# Patient Record
Sex: Female | Born: 1970 | Race: Black or African American | Hispanic: No | Marital: Married | Smoking: Never smoker
Health system: Southern US, Community
[De-identification: ages and names within clinical notes are randomized; demographics above are authoritative.]

## PROBLEM LIST (undated history)

## (undated) DIAGNOSIS — Z789 Other specified health status: Secondary | ICD-10-CM

---

## 1998-08-19 ENCOUNTER — Emergency Department (HOSPITAL_COMMUNITY): Admission: EM | Admit: 1998-08-19 | Discharge: 1998-08-19 | Payer: Self-pay | Admitting: Emergency Medicine

## 1999-10-31 ENCOUNTER — Other Ambulatory Visit: Admission: RE | Admit: 1999-10-31 | Discharge: 1999-10-31 | Payer: Self-pay | Admitting: Obstetrics

## 1999-11-17 ENCOUNTER — Inpatient Hospital Stay (HOSPITAL_COMMUNITY): Admission: AD | Admit: 1999-11-17 | Discharge: 1999-11-17 | Payer: Self-pay | Admitting: Obstetrics

## 1999-11-24 ENCOUNTER — Encounter (INDEPENDENT_AMBULATORY_CARE_PROVIDER_SITE_OTHER): Payer: Self-pay | Admitting: Specialist

## 1999-11-24 ENCOUNTER — Ambulatory Visit (HOSPITAL_COMMUNITY): Admission: AD | Admit: 1999-11-24 | Discharge: 1999-11-24 | Payer: Self-pay | Admitting: Obstetrics

## 1999-11-25 ENCOUNTER — Encounter: Payer: Self-pay | Admitting: Obstetrics

## 2000-03-25 ENCOUNTER — Other Ambulatory Visit: Admission: RE | Admit: 2000-03-25 | Discharge: 2000-03-25 | Payer: Self-pay | Admitting: Obstetrics

## 2000-10-17 ENCOUNTER — Inpatient Hospital Stay (HOSPITAL_COMMUNITY): Admission: AD | Admit: 2000-10-17 | Discharge: 2000-10-21 | Payer: Self-pay | Admitting: Obstetrics

## 2000-10-17 ENCOUNTER — Encounter (INDEPENDENT_AMBULATORY_CARE_PROVIDER_SITE_OTHER): Payer: Self-pay | Admitting: Specialist

## 2011-05-04 NOTE — Op Note (Signed)
[facility]  Patient:    Angela Sutton, Angela Sutton                      MRN: [id number] Proc. Date: 10/18/00 Adm. Date:  [id number] Attending:  Venita Sheffield                           Operative Report  PREOPERATIVE DIAGNOSES:       Uterine pregnancy post dates with a nonreassuring fetal heart pattern and failure to progress.  OPERATION:                    Primary low transverse cervical cesarean section.  POSTOPERATIVE DIAGNOSES:      Uterine pregnancy post dates with a nonreassuring fetal heart pattern and failure to progress, viable female infant with Apgars of 1 and 9.  SURGEON:                      Deniece Ree, M.D.  PEDIATRICS:                   Teaching service.  ANESTHESIA:                   Epidural by Dr. Jean Rosenthal.  ESTIMATED BLOOD LOSS:         300 cc.  DRAINS:                       Foley left to straight drainage.                                Patient tolerated the procedure well and returned to the recovery room in satisfactory condition.  PROCEDURE:                    Patient was taken to the operating room and prepped and draped in the usual fashion for a primary cesarean section.  A low Pfannenstiel incision was made.  This was carried down to the fascia at which time the fascia was entered and excised to the extent of the incision.  The midline was identified and rectus muscles separated.  The abdominal peritoneum was then ______ with Metzenbaum scissors.  The visceroperitoneum was then excised bilaterally toward the round ligaments following which the lower uterine segment was scored, entered in the midline, and bluntly dissected upward.  There was some difficulty.  The infants head was delivered at which time the nasopharynx was sucked out with a suction bulb.  Complete delivery then carried out without any problems.  The cord was clamped and infant turned over to the pediatricians who were in attendance.  The cord  blood was then obtained following which the placenta as well as all products of conception were then manually removed from the uterine cavity.  IV Pitocin as well as IV antibiotics were then begun.  The myometrium was then closed using #1 Chromic in a running locking stitch followed by an embrocating stitch again using #1 Chromic.  Reapproximation using 2-0 Chromic in a running stitch.  At this point the sponge and needle count was correct x 2.  Hemostasis was present. Tubes and ovaries bilaterally appeared to be within normal limits.  The abdominal peritoneum was then closed using 2-0 Chromic in a running stitch followed by closure of the fascia using #1  Vicryl in a running stitch.  A subcuticular stitch using 3-0 Vicryl was utilized to close the skin.  The procedure was then terminated.  The patient tolerated the procedure well and returned to the recovery room in satisfactory condition. DD:  10/18/00 TD:  10/18/00 Job: [id number]

## 2011-05-04 NOTE — H&P (Signed)
[facility]  Patient:    KELLENE, MCCLEARY                      MRN: [id number] Adm. Date:  [id number] Attending:  Venita Sheffield                         History and Physical  HISTORY:                      The patient is a 40 year old gravida 2 abortus 1 whose estimated date of confinement was October 09, 2000.  This is a patient of Dr. Tawny Hopping who was admitted for induction.  The patient progressed satisfactorily to approximately 4 cm, 90% effaced, vertex at a +1 station, at which time she stalled.  Pitocin was started and patient began having a nonreassuring heart rate with late decelerations with contractions.  After we evaluated the patient for several hours, the decision was then to proceed with a cesarean section.  PHYSICAL EXAMINATION:  GENERAL:                      She is a well-developed, well-nourished gravid female in labor-type distress.  HEENT:                        Within normal limits.  NECK:                         Supple.  BREASTS:                      Without masses, tenderness, or discharge.  LUNGS:                        Clear to percussion and auscultation.  HEART:                        Normal sinus rhythm without murmur, rub, or gallop.  ABDOMEN:                      Term gravid.  Fetal heart beats 124 to 248 in the left lower quadrant.  EXTREMITIES AND NEUROLOGIC:   Within normal limits.  PELVIC:                       Reveals cervix that was 4 cm dilated, vertex presentation, at a +1 to a +2 station with moderate amount of molding present.  DIAGNOSES AT THIS TIME:       1.Intrauterine pregnancy, post dates, with a                                 nonreassuring fetal heart pattern.                               2. Failure to progress.  PLAN:                         Proceed with a cesarean section. DD:  10/18/00 TD:  10/18/00 Job: [id number]

## 2011-05-04 NOTE — Discharge Summary (Signed)
[facility]  Patient:    Angela Sutton, Angela Sutton                      MRN: [id number] Adm. Date:  [id number] Disc. Date: 10/21/00 Attending:  Venita Sheffield                           Discharge Summary  HISTORY OF PRESENT ILLNESS:   The patient is a 40 year old, gravida 2, para 0-0-1-0, [facility] October 09, 2000, who was brought in for induction.  Cervix was 1 cm, 50%, and the vertex was 0 to +1 station.  HOSPITAL COURSE:              Amniotomy was performed. The fluid was clear. The patient was started on Pitocin, and the patient underwent primary low transverse cesarean section because of nonreassuring fetal heart rate tracing. She had female with Apgars 1 and 9.  Postop she did fine.  Her hemoglobin was 11.9.  She was discharged home on the third postoperative day.  DISCHARGE MEDICATION:         Tylenol No. 3 one q.4h. p.r.n.  FOLLOWUP:                     See me in six weeks.  DISCHARGE DIAGNOSIS:          Status post primary low transverse cesarean section at term for nonreassuring fetal heart rate tracing. DD:  10/21/00 TD:  10/21/00 Job: [id number]

## 2011-07-18 ENCOUNTER — Encounter (HOSPITAL_COMMUNITY)
Admission: RE | Admit: 2011-07-18 | Discharge: 2011-07-18 | Disposition: A | Discharge status: Home / Self Care | Payer: BC Managed Care – PPO | Source: Ambulatory Visit | Attending: Obstetrics and Gynecology | Admitting: Obstetrics and Gynecology

## 2011-07-18 ENCOUNTER — Encounter (HOSPITAL_COMMUNITY): Payer: Self-pay

## 2011-07-18 ENCOUNTER — Other Ambulatory Visit (HOSPITAL_COMMUNITY): Payer: BC Managed Care – PPO

## 2011-07-18 HISTORY — DX: Other specified health status: Z78.9

## 2011-07-18 LAB — CBC
MCH: 31.2 pg (ref 26.0–34.0)
MCHC: 33.8 g/dL (ref 30.0–36.0)
Platelets: 164 10*3/uL (ref 150–400)
RDW: 13.9 % (ref 11.5–15.5)

## 2011-07-18 LAB — RPR: RPR Ser Ql: NONREACTIVE

## 2011-07-18 NOTE — Patient Instructions (Addendum)
07/26/1219 [name] Angela Sutton  07/18/2011   Your procedure is scheduled on:  07/26/11  Report to [facility] at 8 AM.  Call this number if you have problems the morning of surgery: [phone]   Remember:   Do not eat food:After Midnight.  Do not drink clear liquids: After Midnight.  Take these medicines the morning of surgery with A SIP OF WATER: NA   Do not wear jewelry, make-up or nail polish.  Do not wear lotions, powders, or perfumes. You may wear deodorant.  Do not shave 48 hours prior to surgery.  Do not bring valuables to the hospital.  Contacts, dentures or bridgework may not be worn into surgery.  Leave suitcase in the car. After surgery it may be brought to your room.  For patients admitted to the hospital, checkout time is 11:00 AM the day of discharge.   Patients discharged the day of surgery will not be allowed to drive home.  Name and phone number of your driver: NA  Special Instructions: use CHG wash per written instruction sheet   Please read over the following fact sheets that you were given: MRSA Information

## 2011-07-19 ENCOUNTER — Encounter (HOSPITAL_COMMUNITY): Payer: Self-pay

## 2011-07-24 NOTE — H&P (Signed)
NAMEMarland Sutton  DEBROH, SIELOFF        ACCOUNT NO.:  [id number]  MEDICAL RECORD NO.:  [id number]  LOCATION:  SDC                           FACILITY:  [facility]  PHYSICIAN:  Naima A. Dillard, M.D. DATE OF BIRTH:  12/05/1971  DATE OF ADMISSION:  07/18/2011 DATE OF DISCHARGE:  07/18/2011                             HISTORY & PHYSICAL   Ms. Angela Sutton is a 40 year old gravida 3, para 1-0-1-1, who presents at 51 weeks' for scheduled repeat cesarean section and bilateral tubal sterilization.  Her pregnancy has been remarkable for: 1. Previous cesarean section with a plan for repeat. 2. Low BMI. 3. Advanced maternal age. 4. Group B strep negative.  PRENATAL LABORATORY DATA:  Blood type is AB positive, Rh antibody negative, VDRL nonreactive, rubella titer positive, hepatitis B surface antigen negative, HIV was nonreactive and varicella titer was immune. Hemoglobin upon entering to practice was 13.5.  It was 11.6 at 28 weeks. The patient had a normal first trimester screen.  She had a normal AFP. She had mildly elevated white blood cell count on her new OB labs.  This was repeated.  White blood cells remained 18.4 without any signs and symptoms of infection.  One-hour Glucola was 136.  She had a 3-hour GTT that was normal.  Group B strep was negative at 36 weeks.  Hemoglobin electrophoresis was normal.  HISTORY OF PRESENT PREGNANCY:  The patient entered care at approximately 8-9 weeks' gestation.  She has had a 10-week ultrasound showing an EDC of August 02, 2011, which was concurrent with her LMP dating.  She did want to have first trimester screen.  This was done and was normal.  AFP was also normal.  She had a normal Pap and cultures at her first visit. She was planning a repeat C-section.  We obtained her op note.  It did show a primary low transverse cesarean section secondary to nonreassuring fetal heart rate with a two-layer closure.  Apgars of that child were 1 and 9, but has done  well subsequently.  She had another ultrasound for anatomy at 18 weeks.  There was a left ventricular intracardiac focus noted.  This was resolved on a followup ultrasound at 27 weeks showing also normal growth and normal fluid.  She had an elevated 1-hour Glucola, but a normal 3-hour.  Group B strep culture was negative.  She did also desire tubal sterilization.  OBSTETRICAL HISTORY:  In 2001, she had a miscarriage.  In 2001, she also had a primary low transverse cesarean section for a female infant weight 7 pounds 9 ounces at 41 weeks' gestation.  She was induced.  The C-section was done for nonreassuring fetal heart rate.  Infant had 1/9 Apgars, but did well.  The patient progressed to 5-6 cm.  MEDICAL HISTORY:  History of usual childhood illnesses except she did not note that if she has had chickenpox, therefore, the varicella titer was done during this pregnancy and was noted to be immune.  Her only surgery was a C-section in 2001.  She has a GI sensitivity to Codeine which causes nausea and vomiting.  FAMILY HISTORY:  Maternal grandmother, maternal aunt and the patient's mother all have hypertension.  Her first cousin has insulin-dependent diabetes.  GENETIC HISTORY:  Remarkable for the patient's age of 30 at the time of delivery.  SOCIAL HISTORY:  The patient is married to the father of baby.  He is involved and supportive.  His name is Maris Berger.  The patient has 2 years of college.  She is an Print production planner.  Her husband has 3 years of college.  He is a Occupational hygienist in Educational psychologist.  The patient is Tree surgeon.  She denies religious affiliation.  She has been followed by the [facility].  She denies any alcohol, drug or tobacco use during this pregnancy.  PHYSICAL EXAMINATION:  VITAL SIGNS:  Stable. GENERAL:  The patient is febrile. HEENT: Within normal limits. LUNGS:  Her breath sounds are clear. HEART:  Regular rate and  rhythm without murmur. BREASTS:  Soft and nontender. ABDOMEN:  Fundal height is approximately 38-cm.  Estimated fetal weight is approximately 7-7.5 pounds.  Uterine contractions are very occasionally mild.  Fetal heart rate has been in the 130s-140s on office assessment. PELVIC:  Deferred. EXTREMITIES:  Deep tendon reflexes are 2+ without clonus.  There is no edema noted.  The patient's weight on one of her last prenatal visits was 152 and her height is 5/5.  IMPRESSION: 1. Intrauterine pregnancy at 39 weeks. 2. Previous cesarean section with desire for repeat. 3. Desires tubal sterilization. 4. Advanced maternal age. 5. Elevated white blood cell count during pregnancy without evidence     of infection. 6. Group B strep negative.  PLAN: 1. Admit to [facility] for consult with Dr. Jaymes Graff as attending physician. 2. Routine physician preoperative orders.     Renaldo Reel Emilee Hero, C.N.M.   ______________________________ Pierre Bali Normand Sloop, M.D.    Leeanne Mannan  D:  07/24/2011  T:  07/24/2011  Job:  [id number]

## 2011-07-26 ENCOUNTER — Encounter (HOSPITAL_COMMUNITY): Payer: Self-pay | Admitting: *Deleted

## 2011-07-26 ENCOUNTER — Encounter (HOSPITAL_COMMUNITY)
Admission: RE | Disposition: A | Discharge status: Home / Self Care | Payer: Self-pay | Source: Ambulatory Visit | Attending: Obstetrics and Gynecology

## 2011-07-26 ENCOUNTER — Inpatient Hospital Stay (HOSPITAL_COMMUNITY)
Admission: RE | Admit: 2011-07-26 | Discharge: 2011-07-28 | DRG: 370 | Disposition: A | Discharge status: Home / Self Care | Payer: BC Managed Care – PPO | Source: Ambulatory Visit | Attending: Obstetrics and Gynecology | Admitting: Obstetrics and Gynecology

## 2011-07-26 ENCOUNTER — Other Ambulatory Visit: Payer: Self-pay | Admitting: Obstetrics and Gynecology

## 2011-07-26 ENCOUNTER — Encounter (HOSPITAL_COMMUNITY): Payer: Self-pay | Admitting: Anesthesiology

## 2011-07-26 ENCOUNTER — Inpatient Hospital Stay (HOSPITAL_COMMUNITY): Payer: BC Managed Care – PPO | Admitting: Anesthesiology

## 2011-07-26 ENCOUNTER — Inpatient Hospital Stay (HOSPITAL_COMMUNITY)
Admission: AD | Admit: 2011-07-26 | Payer: BC Managed Care – PPO | Source: Ambulatory Visit | Admitting: Obstetrics and Gynecology

## 2011-07-26 DIAGNOSIS — D649 Anemia, unspecified: Secondary | ICD-10-CM | POA: Diagnosis present

## 2011-07-26 DIAGNOSIS — Z302 Encounter for sterilization: Secondary | ICD-10-CM

## 2011-07-26 DIAGNOSIS — O34219 Maternal care for unspecified type scar from previous cesarean delivery: Principal | ICD-10-CM | POA: Diagnosis present

## 2011-07-26 DIAGNOSIS — O9903 Anemia complicating the puerperium: Secondary | ICD-10-CM | POA: Diagnosis not present

## 2011-07-26 DIAGNOSIS — Z01818 Encounter for other preprocedural examination: Secondary | ICD-10-CM

## 2011-07-26 DIAGNOSIS — Z98891 History of uterine scar from previous surgery: Secondary | ICD-10-CM

## 2011-07-26 DIAGNOSIS — Z01812 Encounter for preprocedural laboratory examination: Secondary | ICD-10-CM

## 2011-07-26 SURGERY — Surgical Case
Anesthesia: Spinal | Site: Abdomen | Laterality: Bilateral | Wound class: Clean Contaminated

## 2011-07-26 SURGERY — Surgical Case
Anesthesia: Spinal

## 2011-07-26 MED ORDER — KETOROLAC TROMETHAMINE 30 MG/ML IJ SOLN
30.0000 mg | Freq: Four times a day (QID) | INTRAMUSCULAR | Status: AC | PRN
  Administered 2011-07-26: 30 mg via INTRAVENOUS
  Filled 2011-07-26: qty 1

## 2011-07-26 MED ORDER — PHENYLEPHRINE 40 MCG/ML (10ML) SYRINGE FOR IV PUSH (FOR BLOOD PRESSURE SUPPORT)
PREFILLED_SYRINGE | INTRAVENOUS | Status: AC
  Filled 2011-07-26: qty 5

## 2011-07-26 MED ORDER — ZOLPIDEM TARTRATE 5 MG PO TABS: 5.0000 mg | ORAL_TABLET | Freq: Every evening | ORAL | Status: DC | PRN

## 2011-07-26 MED ORDER — CITRIC ACID-SODIUM CITRATE 334-500 MG/5ML PO SOLN: 30.0000 mL | Freq: Once | ORAL | Status: DC | PRN

## 2011-07-26 MED ORDER — BUPIVACAINE IN DEXTROSE 0.75-8.25 % IT SOLN
INTRATHECAL | Status: DC | PRN
  Administered 2011-07-26: 1.6 mL via INTRATHECAL

## 2011-07-26 MED ORDER — FLEET ENEMA 7-19 GM/118ML RE ENEM: 1.0000 | ENEMA | RECTAL | Status: DC | PRN

## 2011-07-26 MED ORDER — MENTHOL 3 MG MT LOZG: 1.0000 | LOZENGE | OROMUCOSAL | Status: DC | PRN

## 2011-07-26 MED ORDER — MORPHINE SULFATE (PF) 0.5 MG/ML IJ SOLN
INTRAMUSCULAR | Status: DC | PRN
  Administered 2011-07-26: .15 mg via EPIDURAL

## 2011-07-26 MED ORDER — KETOROLAC TROMETHAMINE 30 MG/ML IJ SOLN: 15.0000 mg | Freq: Once | INTRAMUSCULAR | Status: DC | PRN

## 2011-07-26 MED ORDER — OXYTOCIN 20 UNITS IN LACTATED RINGERS INFUSION - SIMPLE: 125.0000 mL/h | INTRAVENOUS | Status: AC

## 2011-07-26 MED ORDER — IBUPROFEN 600 MG PO TABS
600.0000 mg | ORAL_TABLET | Freq: Four times a day (QID) | ORAL | Status: DC
  Administered 2011-07-27 – 2011-07-28 (×6): 600 mg via ORAL
  Filled 2011-07-26 (×6): qty 1

## 2011-07-26 MED ORDER — PROMETHAZINE HCL 25 MG/ML IJ SOLN: 6.2500 mg | INTRAMUSCULAR | Status: DC | PRN

## 2011-07-26 MED ORDER — DIPHENHYDRAMINE HCL 25 MG PO CAPS: 25.0000 mg | ORAL_CAPSULE | Freq: Four times a day (QID) | ORAL | Status: DC | PRN

## 2011-07-26 MED ORDER — DIBUCAINE 1 % RE OINT: 1.0000 "application " | TOPICAL_OINTMENT | RECTAL | Status: DC | PRN

## 2011-07-26 MED ORDER — LACTATED RINGERS IV SOLN: INTRAVENOUS | Status: DC

## 2011-07-26 MED ORDER — WITCH HAZEL-GLYCERIN EX PADS: 1.0000 "application " | MEDICATED_PAD | CUTANEOUS | Status: DC | PRN

## 2011-07-26 MED ORDER — ACETAMINOPHEN 325 MG PO TABS: 325.0000 mg | ORAL_TABLET | ORAL | Status: DC | PRN

## 2011-07-26 MED ORDER — ONDANSETRON HCL 4 MG/2ML IJ SOLN: 4.0000 mg | INTRAMUSCULAR | Status: DC | PRN

## 2011-07-26 MED ORDER — TETANUS-DIPHTH-ACELL PERTUSSIS 5-2.5-18.5 LF-MCG/0.5 IM SUSP
0.5000 mL | Freq: Once | INTRAMUSCULAR | Status: AC
Start: 2011-07-27 — End: 2011-07-27
  Administered 2011-07-27: 0.5 mL via INTRAMUSCULAR
  Filled 2011-07-26: qty 0.5

## 2011-07-26 MED ORDER — MORPHINE SULFATE 0.5 MG/ML IJ SOLN
INTRAMUSCULAR | Status: AC
  Filled 2011-07-26: qty 10

## 2011-07-26 MED ORDER — EPHEDRINE SULFATE 50 MG/ML IJ SOLN
INTRAMUSCULAR | Status: DC | PRN
  Administered 2011-07-26 (×3): 10 mg via INTRAVENOUS

## 2011-07-26 MED ORDER — FENTANYL CITRATE 0.05 MG/ML IJ SOLN
INTRAMUSCULAR | Status: DC | PRN
  Administered 2011-07-26: 20 ug via INTRAVENOUS

## 2011-07-26 MED ORDER — CEFAZOLIN SODIUM 1-5 GM-% IV SOLN
1.0000 g | INTRAVENOUS | Status: AC
  Administered 2011-07-26: 1 g via INTRAVENOUS

## 2011-07-26 MED ORDER — ONDANSETRON HCL 4 MG/2ML IJ SOLN
INTRAMUSCULAR | Status: DC | PRN
  Administered 2011-07-26: 4 mg via INTRAVENOUS

## 2011-07-26 MED ORDER — KETOROLAC TROMETHAMINE 30 MG/ML IJ SOLN
30.0000 mg | Freq: Four times a day (QID) | INTRAMUSCULAR | Status: DC | PRN
  Administered 2011-07-26: 30 mg via INTRAMUSCULAR

## 2011-07-26 MED ORDER — PHENYLEPHRINE HCL 10 MG/ML IJ SOLN
INTRAMUSCULAR | Status: DC | PRN
  Administered 2011-07-26: 40 ug via INTRAVENOUS
  Administered 2011-07-26 (×2): 80 ug via INTRAVENOUS
  Administered 2011-07-26 (×2): 120 ug via INTRAVENOUS

## 2011-07-26 MED ORDER — HYDROMORPHONE HCL 2 MG PO TABS: 1.0000 mg | ORAL_TABLET | ORAL | Status: DC | PRN

## 2011-07-26 MED ORDER — ONDANSETRON HCL 4 MG/2ML IJ SOLN
INTRAMUSCULAR | Status: AC
  Filled 2011-07-26: qty 2

## 2011-07-26 MED ORDER — FERROUS SULFATE 325 (65 FE) MG PO TABS
325.0000 mg | ORAL_TABLET | Freq: Two times a day (BID) | ORAL | Status: DC
  Administered 2011-07-27 (×2): 325 mg via ORAL
  Filled 2011-07-26 (×3): qty 1

## 2011-07-26 MED ORDER — FAMOTIDINE 20 MG PO TABS: 20.0000 mg | ORAL_TABLET | Freq: Once | ORAL | Status: DC | PRN

## 2011-07-26 MED ORDER — SIMETHICONE 80 MG PO CHEW
80.0000 mg | CHEWABLE_TABLET | Freq: Three times a day (TID) | ORAL | Status: DC
  Administered 2011-07-26 – 2011-07-27 (×5): 80 mg via ORAL

## 2011-07-26 MED ORDER — MEASLES, MUMPS & RUBELLA VAC ~~LOC~~ INJ
0.5000 mL | INJECTION | Freq: Once | SUBCUTANEOUS | Status: DC
  Filled 2011-07-26: qty 0.5

## 2011-07-26 MED ORDER — SENNOSIDES-DOCUSATE SODIUM 8.6-50 MG PO TABS
2.0000 | ORAL_TABLET | Freq: Every day | ORAL | Status: DC
  Administered 2011-07-26 – 2011-07-27 (×2): 2 via ORAL

## 2011-07-26 MED ORDER — PRENATAL PLUS 27-1 MG PO TABS
1.0000 | ORAL_TABLET | Freq: Every day | ORAL | Status: DC
  Administered 2011-07-27: 1 via ORAL
  Filled 2011-07-26: qty 1

## 2011-07-26 MED ORDER — FENTANYL CITRATE 0.05 MG/ML IJ SOLN: 25.0000 ug | INTRAMUSCULAR | Status: DC | PRN

## 2011-07-26 MED ORDER — LANOLIN HYDROUS EX OINT: 1.0000 "application " | TOPICAL_OINTMENT | CUTANEOUS | Status: DC | PRN

## 2011-07-26 MED ORDER — LACTATED RINGERS IV SOLN
INTRAVENOUS | Status: DC
  Administered 2011-07-26: 09:00:00 via INTRAVENOUS

## 2011-07-26 MED ORDER — OXYTOCIN 10 UNIT/ML IJ SOLN
INTRAMUSCULAR | Status: AC
  Filled 2011-07-26: qty 2

## 2011-07-26 MED ORDER — SCOPOLAMINE 1 MG/3DAYS TD PT72: 1.0000 | MEDICATED_PATCH | Freq: Once | TRANSDERMAL | Status: DC | PRN

## 2011-07-26 MED ORDER — PRENATAL PLUS 27-1 MG PO TABS: 1.0000 | ORAL_TABLET | Freq: Every day | ORAL | Status: DC

## 2011-07-26 MED ORDER — OXYTOCIN 10 UNIT/ML IJ SOLN
20.0000 [IU] | INTRAVENOUS | Status: DC | PRN
  Administered 2011-07-26: 20 [IU] via INTRAVENOUS

## 2011-07-26 MED ORDER — DEXAMETHASONE SODIUM PHOSPHATE 10 MG/ML IJ SOLN
INTRAMUSCULAR | Status: DC | PRN
  Administered 2011-07-26 (×2): 5 mg via INTRAVENOUS

## 2011-07-26 MED ORDER — PANTOPRAZOLE SODIUM 40 MG PO TBEC: 40.0000 mg | DELAYED_RELEASE_TABLET | Freq: Once | ORAL | Status: DC | PRN

## 2011-07-26 MED ORDER — KETOROLAC TROMETHAMINE 30 MG/ML IJ SOLN
INTRAMUSCULAR | Status: AC
  Administered 2011-07-26: 30 mg via INTRAMUSCULAR
  Filled 2011-07-26: qty 1

## 2011-07-26 MED ORDER — DEXAMETHASONE SODIUM PHOSPHATE 10 MG/ML IJ SOLN
INTRAMUSCULAR | Status: AC
  Filled 2011-07-26: qty 1

## 2011-07-26 MED ORDER — BISACODYL 10 MG RE SUPP: 10.0000 mg | Freq: Every day | RECTAL | Status: DC | PRN

## 2011-07-26 MED ORDER — METOCLOPRAMIDE HCL 10 MG PO TABS: 10.0000 mg | ORAL_TABLET | Freq: Once | ORAL | Status: DC | PRN

## 2011-07-26 MED ORDER — FENTANYL CITRATE 0.05 MG/ML IJ SOLN
INTRAMUSCULAR | Status: AC
  Filled 2011-07-26: qty 2

## 2011-07-26 MED ORDER — METOCLOPRAMIDE HCL 5 MG/ML IJ SOLN
INTRAMUSCULAR | Status: AC
  Administered 2011-07-26: 10 mg via INTRAVENOUS
  Filled 2011-07-26: qty 2

## 2011-07-26 MED ORDER — METOCLOPRAMIDE HCL 5 MG/ML IJ SOLN
10.0000 mg | Freq: Three times a day (TID) | INTRAMUSCULAR | Status: DC | PRN
  Administered 2011-07-26: 10 mg via INTRAVENOUS

## 2011-07-26 MED ORDER — SCOPOLAMINE 1 MG/3DAYS TD PT72
1.0000 | MEDICATED_PATCH | TRANSDERMAL | Status: DC
  Administered 2011-07-26: 1.5 mg via TRANSDERMAL

## 2011-07-26 MED ORDER — ONDANSETRON HCL 4 MG PO TABS: 4.0000 mg | ORAL_TABLET | ORAL | Status: DC | PRN

## 2011-07-26 SURGICAL SUPPLY — 36 items
APL SKNCLS STERI-STRIP NONHPOA (GAUZE/BANDAGES/DRESSINGS) ×1
BARRIER ADHS 3X4 INTERCEED (GAUZE/BANDAGES/DRESSINGS) ×1 IMPLANT
BENZOIN TINCTURE PRP APPL 2/3 (GAUZE/BANDAGES/DRESSINGS) ×2 IMPLANT
BRR ADH 4X3 ABS CNTRL BYND (GAUZE/BANDAGES/DRESSINGS) ×1
CLOTH BEACON ORANGE TIMEOUT ST (SAFETY) ×2 IMPLANT
CONTAINER PREFILL 10% NBF 15ML (MISCELLANEOUS) ×2 IMPLANT
DRAIN JACKSON PRT FLT 10 (DRAIN) IMPLANT
DRSG COVADERM 4X10 (GAUZE/BANDAGES/DRESSINGS) ×1 IMPLANT
ELECT REM PT RETURN 9FT ADLT (ELECTROSURGICAL) ×2
ELECTRODE REM PT RTRN 9FT ADLT (ELECTROSURGICAL) ×1 IMPLANT
EVACUATOR SILICONE 100CC (DRAIN) IMPLANT
EXTRACTOR VACUUM M CUP 4 TUBE (SUCTIONS) IMPLANT
GLOVE BIO SURGEON STRL SZ 6.5 (GLOVE) ×2 IMPLANT
GLOVE BIOGEL PI IND STRL 7.0 (GLOVE) ×1 IMPLANT
GLOVE BIOGEL PI INDICATOR 7.0 (GLOVE) ×1
GOWN PREVENTION PLUS LG XLONG (DISPOSABLE) ×6 IMPLANT
KIT ABG SYR 3ML LUER SLIP (SYRINGE) IMPLANT
NDL HYPO 25X5/8 SAFETYGLIDE (NEEDLE) IMPLANT
NEEDLE HYPO 25X5/8 SAFETYGLIDE (NEEDLE) IMPLANT
NS IRRIG 1000ML POUR BTL (IV SOLUTION) ×3 IMPLANT
PACK C SECTION WH (CUSTOM PROCEDURE TRAY) ×2 IMPLANT
RTRCTR C-SECT PINK 25CM LRG (MISCELLANEOUS) IMPLANT
SLEEVE SCD COMPRESS KNEE MED (MISCELLANEOUS) IMPLANT
STAPLER VISISTAT 35W (STAPLE) IMPLANT
STRIP CLOSURE SKIN 1/2X4 (GAUZE/BANDAGES/DRESSINGS) ×2 IMPLANT
SUT CHROMIC 0 CT 1 (SUTURE) ×2 IMPLANT
SUT MNCRL AB 3-0 PS2 27 (SUTURE) ×1 IMPLANT
SUT PLAIN 2 0 (SUTURE) ×4
SUT PLAIN 2 0 XLH (SUTURE) ×1 IMPLANT
SUT PLAIN ABS 2-0 CT1 27XMFL (SUTURE) ×2 IMPLANT
SUT SILK 2 0 SH (SUTURE) IMPLANT
SUT VIC AB 0 CTX 36 (SUTURE) ×12
SUT VIC AB 0 CTX36XBRD ANBCTRL (SUTURE) ×4 IMPLANT
TOWEL OR 17X24 6PK STRL BLUE (TOWEL DISPOSABLE) ×4 IMPLANT
TRAY FOLEY CATH 14FR (SET/KITS/TRAYS/PACK) ×2 IMPLANT
WATER STERILE IRR 1000ML POUR (IV SOLUTION) ×1 IMPLANT

## 2011-07-26 NOTE — Anesthesia Preprocedure Evaluation (Signed)
Anesthesia Evaluation  Name, MR# and DOB Patient awake  General Assessment Comment  Reviewed: Allergy & Precautions, H&P , Patient's Chart, lab work & pertinent test results  Airway Mallampati: II TM Distance: >3 FB Neck ROM: full    Dental No notable dental hx.    Pulmonary  clear to auscultation  pulmonary exam normalPulmonary Exam Normal breath sounds clear to auscultation none    Cardiovascular regular Normal    Neuro/Psych Negative Neurological ROS  Negative Psych ROS  GI/Hepatic/Renal negative GI ROS, negative Liver ROS, and negative Renal ROS (+)       Endo/Other  Negative Endocrine ROS (+)      Abdominal   Musculoskeletal   Hematology negative hematology ROS (+)   Peds  Reproductive/Obstetrics (+) Pregnancy    Anesthesia Other Findings             Anesthesia Physical Anesthesia Plan  ASA: II  Anesthesia Plan: Spinal   Post-op Pain Management:    Induction:   Airway Management Planned:   Additional Equipment:   Intra-op Plan:   Post-operative Plan:   Informed Consent: I have reviewed the patients History and Physical, chart, labs and discussed the procedure including the risks, benefits and alternatives for the proposed anesthesia with the patient or authorized representative who has indicated his/her understanding and acceptance.     Plan Discussed with:   Anesthesia Plan Comments:         Anesthesia Quick Evaluation

## 2011-07-26 NOTE — Consult Note (Signed)
Neonatology Note:   Attendance at C-section:    I was asked to attend this repeat C/S at term. The mother is a G3P1A1 AB pos, GBS neg with an uncomplicated pregnancy. ROM at delivery, fluid clear. Infant vigorous with good spontaneous cry and tone. Needed only minimal bulb suctioning. Ap 9/9. Lungs clear to ausc in DR. To CN to care of Pediatrician.   Deatra James, MD

## 2011-07-26 NOTE — Progress Notes (Signed)
Mother states with last child she only breast fed while in hospital and plans to do the same with this child. Left nipple flat and dimpled. Right nipple semi erect.  Pre pumped right nipple and infant latched well with assistance . inst mother in positioning and good support. Lots of teaching on benefits of exclusive breastfeeding for at least 6 months.  Parents receptive to all teaching.

## 2011-07-26 NOTE — Anesthesia Procedure Notes (Signed)
Spinal Block  Patient location during procedure: OR Staffing Anesthesiologist: Emiliya Chretien EDWARD Preanesthetic Checklist Completed: patient identified, site marked, surgical consent, pre-op evaluation, timeout performed, IV checked, risks and benefits discussed and monitors and equipment checked Spinal Block Patient position: sitting Prep: DuraPrep Patient monitoring: heart rate, cardiac monitor, continuous pulse ox and blood pressure Approach: midline Location: L3-4 Injection technique: single-shot Needle Needle type: Sprotte  Needle gauge: 24 G Needle length: 9 cm Assessment Sensory level: T4 Additional Notes Spinal Dosage in OR  Bupivicaine ml       1.6 PFMS04   mcg        150 Fentanyl mcg            20    

## 2011-07-26 NOTE — Anesthesia Postprocedure Evaluation (Addendum)
Anesthesia Post Note  Patient: Angela Sutton  Procedure(s) Performed:  CESAREAN SECTION WITH BILATERAL TUBAL LIGATION - Repeat Cesarean Section with Delivery Baby Boy @ [phone], Apgars 9/9; Bilateral Tubal Ligation  Anesthesia type: Spinal  Patient location: PACU  Post pain: Pain level controlled  Post assessment: Post-op Vital signs reviewed  Last Vitals:  Filed Vitals:   07/26/11 1115  BP: 124/62  Pulse: 85  Temp:   Resp: 16    Post vital signs: Reviewed  Level of consciousness: awake  Complications: No apparent anesthesia complications

## 2011-07-26 NOTE — Op Note (Signed)
Cesarean Section Procedure Note   AKANSHA WYCHE  07/26/2011  Indications: Scheduled Proceedure/Maternal Request   Pre-operative Diagnosis: previous c/s desires sterilization.   Post-operative Diagnosis: Same   Surgeon: Surgeon(s) and Role:    * Michael Litter, MD - Primary   Assistants: Nigel Bridgeman CNM  Anesthesia: spinal   Procedure Details:  The patient was seen in the Holding Room. The risks, benefits, complications, treatment options, and expected outcomes were discussed with the patient. The patient concurred with the proposed plan, giving informed consent. identified as Hillery Hunter and the procedure verified as C-Section Delivery. A Time Out was held and the above information confirmed.  After induction of anesthesia, the patient was draped and prepped in the usual sterile manner. A transverse was made and carried down through the subcutaneous tissue to the fascia. Fascial incision was made and extended transversely using mayo scissors. The fascia was separated from the underlying rectus tissue superiorly and inferiorly using cokers and mayo scissors. The peritoneum was identified and entered. Peritoneal incision was extended longitudinally with good visualization of bowel and bladder. The utero-vesical peritoneal reflection was incised transversely and the bladder flap was bluntly freed from the lower uterine segment. A low transverse uterine incision was made. Delivered from cephalic presentation was a 8-4 pounds with Apgar scores of 9 at one minute and 9 at five minutes. Cord ph was not sent the umbilical cord was clamped and cut cord blood was obtained for evaluation. The placenta was removed Intact and appeared normal. I did curette the uterus to remove some remaining membrane.   The uterine outline, tubes and ovaries appeared normal}. The uterine incision was closed with running locked sutures of 0Vicryl. A second layer 0 vicrlyl was used to imbricate the uterine  incision    Hemostasis was observed. Lavage was carried out until clear. The left fallopian tube was grasped with babcock clamp and a centimeter of tube was ligated with 2-0 plain suture and excised. The fight fallopian tube was ligated and excised in a similar way.  There was bleeding noted from the area just below the ligation.  A kelly clamp was placed below the bleeding and a o vicrly free tie was used to achieve hemostasis.  Interceede was placed  Along the uterus in a T  Shape.  The peritoneum was closed with 0 chromic.  The fascia was then reapproximated with running sutures of 0 vicryl. The  The skin was closed with 3-22monocryl.   Instrument, sponge, and needle counts were correct prior the abdominal closure and were correct at the conclusion of the case.    Findings:female infant in vertex presentation.  Clear fluid was noted.     Estimated Blood Loss: 700cc  Total IV Fluids:   Urine Output: 150CC OF clear urine  Specimens:  Specimens    None       Complications: no complications  Disposition: PACU - hemodynamically stable.   Maternal Condition: stable   Baby condition / location:  nursery-stable  Attending Attestation: I was present and scrubbed for the entire procedure.   Signed: Surgeon(s): Michael Litter, MD

## 2011-07-27 LAB — CBC
MCH: 30.4 pg (ref 26.0–34.0)
MCHC: 32.8 g/dL (ref 30.0–36.0)
Platelets: 160 10*3/uL (ref 150–400)
RBC: 3.68 MIL/uL — ABNORMAL LOW (ref 3.87–5.11)

## 2011-07-27 MED ORDER — HYDROCODONE-ACETAMINOPHEN 5-325 MG PO TABS
1.0000 | ORAL_TABLET | ORAL | Status: DC | PRN
  Administered 2011-07-28: 1 via ORAL
  Filled 2011-07-27: qty 1

## 2011-07-27 MED ORDER — ACETAMINOPHEN 500 MG PO TABS
1000.0000 mg | ORAL_TABLET | Freq: Four times a day (QID) | ORAL | Status: DC | PRN
  Filled 2011-07-27: qty 2

## 2011-07-27 MED ORDER — MEASLES, MUMPS & RUBELLA VAC ~~LOC~~ INJ: 0.5000 mL | INJECTION | SUBCUTANEOUS | Status: DC | PRN

## 2011-07-27 NOTE — Plan of Care (Signed)
Problem: Discharge Progression Outcomes Goal: MMR given as ordered Outcome: Not Progressing Rubella - equivical  NEEDS

## 2011-07-27 NOTE — Progress Notes (Addendum)
Subjective: Postpartum Day 1: Cesarean Delivery Patient reports no problems voiding.  Up ad lib.  No flatus yet.  Breastfeeding.    Objective: Vital signs in last 24 hours: Temp:  [97.4 F (36.3 C)-98.1 F (36.7 C)] 98 F (36.7 C) (08/10 0414) Pulse Rate:  [63-107] 72  (08/10 0414) Resp:  [16-23] 20  (08/10 0414) BP: (96-146)/(53-80) 96/63 mmHg (08/10 0414) SpO2:  [98 %-100 %] 99 % (08/10 0414)  Physical Exam:  General: alert Lochia: appropriate Uterine Fundus: firm Incision: Dressing with old drainage marked, no advancement overnight. DVT Evaluation: No evidence of DVT seen on physical exam.   Basename 07/27/11 0515  HGB 11.2*  HCT 34.1*    Assessment/Plan: Status post Cesarean section and BTL day 1.  Doing well postoperatively.  Continue current care.  LATHAM,VICKI L 07/27/2011, 8:36 AM   WBC = 19,000.  WILL REPEAT IN AM Dierdre Forth, MD  @now @

## 2011-07-27 NOTE — Anesthesia Postprocedure Evaluation (Signed)
  Anesthesia Post-op Note  Patient: Angela Sutton  Procedure(s) Performed:  CESAREAN SECTION WITH BILATERAL TUBAL LIGATION - Repeat Cesarean Section with Delivery Baby Boy @ [phone], Apgars 9/9; Bilateral Tubal Ligation  Patient Location: PACU and Mother/Baby  Anesthesia Type: Regional and Spinal  Level of Consciousness: awake, alert  and oriented  Airway and Oxygen Therapy: Patient Spontanous Breathing  Post-op Pain: none  Post-op Assessment: Post-op Vital signs reviewed  Post-op Vital Signs: Reviewed and stable  Complications: No apparent anesthesia complications

## 2011-07-28 ENCOUNTER — Encounter (HOSPITAL_COMMUNITY): Payer: Self-pay | Admitting: *Deleted

## 2011-07-28 DIAGNOSIS — D649 Anemia, unspecified: Secondary | ICD-10-CM | POA: Diagnosis present

## 2011-07-28 DIAGNOSIS — Z98891 History of uterine scar from previous surgery: Secondary | ICD-10-CM

## 2011-07-28 LAB — CBC
MCH: 30.3 pg (ref 26.0–34.0)
MCV: 93.6 fL (ref 78.0–100.0)
Platelets: 165 10*3/uL (ref 150–400)
RDW: 14.1 % (ref 11.5–15.5)
WBC: 15.1 10*3/uL — ABNORMAL HIGH (ref 4.0–10.5)

## 2011-07-28 LAB — DIFFERENTIAL
Basophils Absolute: 0 10*3/uL (ref 0.0–0.1)
Eosinophils Absolute: 0.1 10*3/uL (ref 0.0–0.7)
Eosinophils Relative: 0 % (ref 0–5)

## 2011-07-28 MED ORDER — SENNOSIDES-DOCUSATE SODIUM 8.6-50 MG PO TABS: 2.0000 | ORAL_TABLET | Freq: Two times a day (BID) | ORAL | Status: AC | PRN

## 2011-07-28 MED ORDER — BISACODYL 10 MG RE SUPP: 10.0000 mg | Freq: Every day | RECTAL | Status: AC | PRN

## 2011-07-28 MED ORDER — HYDROCODONE-ACETAMINOPHEN 5-325 MG PO TABS: 1.0000 | ORAL_TABLET | ORAL | Status: AC | PRN

## 2011-07-28 MED ORDER — FERROUS SULFATE 325 (65 FE) MG PO TABS: 325.0000 mg | ORAL_TABLET | Freq: Two times a day (BID) | ORAL | Status: AC

## 2011-07-28 MED ORDER — IBUPROFEN 600 MG PO TABS: 600.0000 mg | ORAL_TABLET | Freq: Four times a day (QID) | ORAL | Status: AC | PRN

## 2011-07-28 NOTE — Progress Notes (Signed)
Subjective: Postpartum Day 2: Cesarean Delivery Patient reports tolerating PO, + flatus and no problems voiding.    Objective: Vital signs in last 24 hours: Temp:  [98 F (36.7 C)-98.6 F (37 C)] 98.6 F (37 C) (08/11 0600) Pulse Rate:  [97-101] 100  (08/11 0600) Resp:  [18-19] 19  (08/11 0600) BP: (95-134)/(53-75) 96/53 mmHg (08/11 0600) SpO2:  [99 %] 99 % (08/11 0600)  Physical Exam:  General: alert, cooperative, appears stated age and no distress Lochia: appropriate Uterine Fundus: firm Incision: healing well, no significant drainage, no dehiscence, no significant erythema DVT Evaluation: No evidence of DVT seen on physical exam. No cords or calf tenderness. No significant calf/ankle edema.   Basename 07/28/11 0810 07/27/11 0515  HGB 10.9* 11.2*  HCT 33.7* 34.1*    Assessment/Plan: Status post Cesarean section. Doing well postoperatively.  Discharge home with standard precautions and return to clinic in 4-6 weeks per patient request.  Amauri Keefe P 07/28/2011, 10:39 AM

## 2011-07-28 NOTE — Discharge Summary (Signed)
Obstetric Discharge Summary Reason for Admission: cesarean section Prenatal Procedures: none Intrapartum Procedures: cesarean: low cervical, transverse Postpartum Procedures: none Complications-Operative and Postpartum: none Hemoglobin  Date Value Range Status  07/28/2011 10.9* 12.0-15.0 (g/dL) Final     HCT  Date Value Range Status  07/28/2011 33.7* 36.0-46.0 (%) Final    Discharge Diagnoses: Term Pregnancy-delivered and history of prior cesarean.  Desire for surgical sterilization  Discharge Information: Date: 07/28/2011 Activity: pelvic rest and per discharge instruction booklet Diet: routine and see Med rec Medications: Ibuprophen, Viocodin and see AVS Condition: stable and improved Instructions: refer to practice specific booklet Discharge to: home Hosp course;  Patient was admitted via the OR after repeat Cesarean section and tubal ligation. Did well post op.  Tolerated post op anemia well.  Requested discharge home on POD#2.  Tolerating po's well, good pain management.  Newborn Data: Live born female  Birth Weight: 8 lb 3.9 oz (3740 g) APGAR: 9, 9  Home with mother.  Angela Sutton,Angela Sutton 07/28/2011, 10:56 AM

## 2011-08-10 NOTE — Transfer of Care (Signed)
  Anesthesia Post-op Note  Patient: Angela Sutton  Procedure(s) Performed:  CESAREAN SECTION WITH BILATERAL TUBAL LIGATION - Repeat Cesarean Section with Delivery Baby Boy @ [phone], Apgars 9/9; Bilateral Tubal Ligation  Patient Location: PACU  Anesthesia Type: Spinal  Level of Consciousness: awake, alert  and oriented  Airway and Oxygen Therapy: Patient Spontanous Breathing  Post-op Pain: none  Post-op Assessment: Post-op Vital signs reviewed  Post-op Vital Signs: Reviewed  Complications: No apparent anesthesia complications

## 2011-08-10 NOTE — Transfer of Care (Deleted)
  Anesthesia Post-op Note  Patient: Angela Sutton  Procedure(s) Performed:  CESAREAN SECTION WITH BILATERAL TUBAL LIGATION - Repeat Cesarean Section with Delivery Baby Boy @ [phone], Apgars 9/9; Bilateral Tubal Ligation  Patient Location: PACU  Anesthesia Type: Spinal  Level of Consciousness: awake, alert  and oriented  Airway and Oxygen Therapy: Patient Spontanous Breathing  Post-op Pain: none  Post-op Assessment: Post-op Vital signs reviewed  Post-op Vital Signs: Reviewed and stable  Complications: No apparent anesthesia complications

## 2012-03-19 ENCOUNTER — Encounter (HOSPITAL_COMMUNITY): Payer: Self-pay | Admitting: *Deleted

## 2014-10-18 ENCOUNTER — Encounter (HOSPITAL_COMMUNITY): Payer: Self-pay | Admitting: *Deleted

## 2018-05-27 ENCOUNTER — Other Ambulatory Visit: Payer: Self-pay

## 2018-05-27 ENCOUNTER — Ambulatory Visit (HOSPITAL_COMMUNITY)
Admission: EM | Admit: 2018-05-27 | Discharge: 2018-05-27 | Disposition: A | Discharge status: Home / Self Care | Payer: BC Managed Care – PPO | Attending: Family Medicine | Admitting: Family Medicine

## 2018-05-27 ENCOUNTER — Ambulatory Visit (INDEPENDENT_AMBULATORY_CARE_PROVIDER_SITE_OTHER): Payer: BC Managed Care – PPO

## 2018-05-27 ENCOUNTER — Encounter (HOSPITAL_COMMUNITY): Payer: Self-pay | Admitting: Emergency Medicine

## 2018-05-27 DIAGNOSIS — S63259A Unspecified dislocation of unspecified finger, initial encounter: Secondary | ICD-10-CM

## 2018-05-27 DIAGNOSIS — Y9364 Activity, baseball: Secondary | ICD-10-CM | POA: Diagnosis not present

## 2018-05-27 DIAGNOSIS — S63256A Unspecified dislocation of right little finger, initial encounter: Secondary | ICD-10-CM

## 2018-05-27 DIAGNOSIS — S63286A Dislocation of proximal interphalangeal joint of right little finger, initial encounter: Secondary | ICD-10-CM | POA: Diagnosis not present

## 2018-05-27 MED ORDER — LIDOCAINE HCL 2 % IJ SOLN
INTRAMUSCULAR | Status: AC
  Filled 2018-05-27: qty 20

## 2018-05-27 NOTE — Discharge Instructions (Signed)
SPLINT OR BUDDY TAPE TO PROTECT FINGER FOR 2-3 WEEKS IBUPROFEN FOR PAIN  ICE AND ELEVATE TO REDUCE SWELLING

## 2018-05-27 NOTE — ED Provider Notes (Signed)
[facility]    CSN: [id number] Arrival date & time: 05/27/18  1001     History   Chief Complaint Chief Complaint  Patient presents with  . Finger Injury    HPI Angela Sutton is a 47 y.o. female.   HPI  Patient injured her right fifth finger playing softball last night.  She states she had it sliding into a base and felt immediate pop and pain.  She is been using ice.  Ibuprofen for pain.  She has deformity and pain at the DIP.  Normal sensation at the tip.  No prior hand or finger injuries.  She is in good health and on no medications except for vitamins.  Past Medical History:  Diagnosis Date  . No pertinent past medical history     Patient Active Problem List   Diagnosis Date Noted  . H/O: cesarean section 07/28/2011  . Anemia 07/28/2011    Past Surgical History:  Procedure Laterality Date  . CESAREAN SECTION  2001    OB History    Gravida  4   Para  2   Term  2   Preterm      AB  1   Living  2     SAB  1   TAB      Ectopic      Multiple      Live Births  1            Home Medications    Prior to Admission medications   Medication Sig Start Date End Date Taking? Authorizing Provider  ferrous sulfate 325 (65 FE) MG tablet Take 1 tablet (325 mg total) by mouth 2 (two) times daily with a meal. 07/28/11 07/27/12  Haygood, Maris BergerVanessa P, MD  prenatal vitamin w/FE, FA (PRENATAL 1 + 1) 27-1 MG TABS Take 1 tablet by mouth daily.      [provider]    Family History Family History  Problem Relation Age of Onset  . Healthy Mother   . Healthy Father     Social History Social History   Tobacco Use  . Smoking status: Never Smoker  Substance Use Topics  . Alcohol use: No  . Drug use: No     Allergies   Codeine   Review of Systems Review of Systems  Constitutional: Negative for chills and fever.  HENT: Negative for ear pain and sore throat.   Eyes: Negative for pain and visual disturbance.  Respiratory:  Negative for cough and shortness of breath.   Cardiovascular: Negative for chest pain and palpitations.  Gastrointestinal: Negative for abdominal pain and vomiting.  Genitourinary: Negative for dysuria and hematuria.  Musculoskeletal: Positive for arthralgias. Negative for back pain.       Right fifth finger pain  Skin: Negative for color change and rash.  Neurological: Negative for seizures and syncope.  All other systems reviewed and are negative.    Physical Exam Triage Vital Signs ED Triage Vitals  Enc Vitals Group     BP 05/27/18 1017 (!) 153/92     Pulse Rate 05/27/18 1017 80     Resp 05/27/18 1017 16     Temp 05/27/18 1017 98.7 F (37.1 C)     Temp Source 05/27/18 1017 Oral     SpO2 05/27/18 1017 100 %     Weight --      Height --      Head Circumference --      Peak Flow --  Pain Score 05/27/18 1038 7     Pain Loc --      Pain Edu? --      Excl. in GC? --    No data found.  Updated Vital Signs BP (!) 153/92 (BP Location: Right Arm)   Pulse 80   Temp 98.7 F (37.1 C) (Oral)   Resp 16   LMP 05/23/2018   SpO2 100%       Physical Exam  Constitutional: She appears well-developed and well-nourished. No distress.  HENT:  Head: Normocephalic and atraumatic.  Mouth/Throat: Oropharynx is clear and moist.  Eyes: Pupils are equal, round, and reactive to light. Conjunctivae are normal.  Neck: Normal range of motion.  Cardiovascular: Normal rate.  Pulmonary/Chest: Effort normal. No respiratory distress.  Abdominal: Soft. She exhibits no distension.  Musculoskeletal: Normal range of motion. She exhibits no edema.  The fifth finger on the right hand has obvious deformity and swelling at the DIP.  Limited range of motion.  Normal sensory exam.  Normal cap refill.  Neurological: She is alert.  Skin: Skin is warm and dry.     UC Treatments / Results  Labs (all labs ordered are listed, but only abnormal results are displayed) Labs Reviewed - No data to  display  EKG None  Radiology Dg Finger Little Right  Result Date: 05/27/2018 CLINICAL DATA:  Status post reduction of dorsal dislocation of the DIP joint of the right little finger due to an injury playing softball. Initial encounter. EXAM: RIGHT LITTLE FINGER 2+V COMPARISON:  Plain films right little finger the same day. FINDINGS: The DIP joint has been reduced. No fracture is identified. Soft tissues of the little finger appear swollen. IMPRESSION: Successful reduction of dislocation.  Negative for fracture. Electronically Signed   By: Drusilla Kanner M.D.   On: 05/27/2018 11:10   Dg Finger Little Right  Result Date: 05/27/2018 CLINICAL DATA:  Injury playing softball. EXAM: RIGHT LITTLE FINGER 2+V COMPARISON:  No recent. FINDINGS: Posteriorly dislocated distal phalanx of the right fifth digit noted. No evidence of fracture. IMPRESSION: Posterior dislocation of the distal phalanx of the right fifth digit. Electronically Signed   By: Maisie Fus  Register   On: 05/27/2018 10:32    Procedures Orthopedic Injury Treatment Date/Time: 05/27/2018 1:39 PM Performed by: Eustace Moore, MD Authorized by: Eustace Moore, MD   Consent:    Consent obtained:  Verbal   Consent given by:  Patient   Risks discussed:  Irreducible dislocation, nerve damage, recurrent dislocation and restricted joint movement   Alternatives discussed:  No treatmentInjury location: hand Location details: right hand Injury type: dislocation Pre-procedure neurovascular assessment: neurovascularly intact Pre-procedure distal perfusion: normal Pre-procedure neurological function: normal Pre-procedure range of motion: reduced Anesthesia: digital block  Anesthesia: Local anesthesia used: yes Local Anesthetic: lidocaine 2% without epinephrine Anesthetic total: 2 mL  Patient sedated: NoManipulation performed: yes Immobilization: splint Splint type: static finger Supplies used: aluminum splint Post-procedure  neurovascular assessment: post-procedure neurovascularly intact Post-procedure distal perfusion: normal Post-procedure neurological function: normal Post-procedure range of motion: normal Patient tolerance: Patient tolerated the procedure well with no immediate complications Comments: Digital block performed.  Good anesthesia.  Finger easily reduced.  Repeat x-rays document good positioning with no fracture.  Splint applied.  Post procedure care reviewed.    (including critical care time)  Medications Ordered in UC Medications - No data to display  Initial Impression / Assessment and Plan / UC Course  I have reviewed the triage vital signs and the  nursing notes.  Pertinent labs & imaging results that were available during my care of the patient were reviewed by me and considered in my medical decision making (see chart for details).      Final Clinical Impressions(s) / UC Diagnoses   Final diagnoses:  Dislocation of finger, initial encounter     Discharge Instructions     SPLINT OR BUDDY TAPE TO PROTECT FINGER FOR 2-3 WEEKS IBUPROFEN FOR PAIN  ICE AND ELEVATE TO REDUCE SWELLING   ED Prescriptions    None     Controlled Substance Prescriptions [place] Controlled Substance Registry consulted? Not Applicable   Eustace Moore, MD 05/27/18 1341

## 2018-05-27 NOTE — ED Triage Notes (Signed)
Right little finger injury.  Seen by provider prior to this nurse

## 2018-05-27 NOTE — ED Notes (Signed)
Dr Delton Seenelson applied finger splint

## 2019-09-14 ENCOUNTER — Other Ambulatory Visit: Payer: Self-pay

## 2019-09-14 DIAGNOSIS — Z20822 Contact with and (suspected) exposure to covid-19: Secondary | ICD-10-CM

## 2019-09-15 LAB — NOVEL CORONAVIRUS, NAA: SARS-CoV-2, NAA: NOT DETECTED

## 2020-01-28 ENCOUNTER — Ambulatory Visit: Payer: BC Managed Care – PPO | Attending: Family

## 2020-01-28 DIAGNOSIS — Z23 Encounter for immunization: Secondary | ICD-10-CM

## 2020-01-28 NOTE — Progress Notes (Signed)
   Covid-19 Vaccination Clinic  Name:  Angela Sutton    MRN: [id number] DOB: 1971/06/12  01/28/2020  Angela Sutton was observed post Covid-19 immunization for 15 minutes without incidence. She was provided with Vaccine Information Sheet and instruction to access the V-Safe system.   Angela Sutton was instructed to call 911 with any severe reactions post vaccine: Marland Kitchen Difficulty breathing  . Swelling of your face and throat  . A fast heartbeat  . A bad rash all over your body  . Dizziness and weakness    Immunizations Administered    Name Date Dose VIS Date Route   Moderna COVID-19 Vaccine 01/28/2020  2:26 PM 0.5 mL 11/17/2019 Intramuscular   Manufacturer: Moderna   Lot: [id number]   NDC: 21624-469-50

## 2020-03-01 ENCOUNTER — Ambulatory Visit: Payer: BC Managed Care – PPO | Attending: Internal Medicine

## 2020-03-01 DIAGNOSIS — Z23 Encounter for immunization: Secondary | ICD-10-CM

## 2020-03-01 NOTE — Progress Notes (Signed)
   Covid-19 Vaccination Clinic  Name:  Angela Sutton    MRN: [id number] DOB: December 08, 1971  03/01/2020  [name] was observed post Covid-19 immunization for 15 minutes without incident. She was provided with Vaccine Information Sheet and instruction to access the V-Safe system.   [name] was instructed to call 911 with any severe reactions post vaccine: Marland Kitchen Difficulty breathing  . Swelling of face and throat  . A fast heartbeat  . A bad rash all over body  . Dizziness and weakness   Immunizations Administered    Name Date Dose VIS Date Route   Moderna COVID-19 Vaccine 03/01/2020  2:26 PM 0.5 mL 11/17/2019 Intramuscular   Manufacturer: Moderna   Lot: [id number]   NDC: 47583-074-60

## 2020-12-23 ENCOUNTER — Ambulatory Visit: Payer: BC Managed Care – PPO | Attending: Family

## 2020-12-23 DIAGNOSIS — Z23 Encounter for immunization: Secondary | ICD-10-CM

## 2021-04-19 NOTE — Progress Notes (Signed)
   Covid-19 Vaccination Clinic  Name:  Angela Sutton    MRN: [id number] DOB: 21-Aug-1971  04/19/2021  [name] was observed post Covid-19 immunization for 15 minutes without incident. She was provided with Vaccine Information Sheet and instruction to access the V-Safe system.   [name] was instructed to call 911 with any severe reactions post vaccine: Marland Kitchen Difficulty breathing  . Swelling of face and throat  . A fast heartbeat  . A bad rash all over body  . Dizziness and weakness   Immunizations Administered    Name Date Dose VIS Date Route   Moderna Covid-19 Booster Vaccine 12/23/2020 11:15 AM 0.25 mL 10/05/2020 Intramuscular   Manufacturer: Moderna   Lot: [id number]   NDC: 31594-585-92

## 2021-09-20 ENCOUNTER — Emergency Department (HOSPITAL_BASED_OUTPATIENT_CLINIC_OR_DEPARTMENT_OTHER): Payer: BC Managed Care – PPO | Admitting: Radiology

## 2021-09-20 ENCOUNTER — Emergency Department (HOSPITAL_BASED_OUTPATIENT_CLINIC_OR_DEPARTMENT_OTHER)
Admission: EM | Admit: 2021-09-20 | Discharge: 2021-09-20 | Disposition: A | Discharge status: Home / Self Care | Payer: BC Managed Care – PPO | Attending: Emergency Medicine | Admitting: Emergency Medicine

## 2021-09-20 ENCOUNTER — Encounter (HOSPITAL_BASED_OUTPATIENT_CLINIC_OR_DEPARTMENT_OTHER): Payer: Self-pay

## 2021-09-20 ENCOUNTER — Other Ambulatory Visit: Payer: Self-pay

## 2021-09-20 DIAGNOSIS — M79661 Pain in right lower leg: Secondary | ICD-10-CM | POA: Insufficient documentation

## 2021-09-20 DIAGNOSIS — X58XXXA Exposure to other specified factors, initial encounter: Secondary | ICD-10-CM | POA: Insufficient documentation

## 2021-09-20 DIAGNOSIS — S96901A Unspecified injury of unspecified muscle and tendon at ankle and foot level, right foot, initial encounter: Secondary | ICD-10-CM | POA: Diagnosis present

## 2021-09-20 DIAGNOSIS — S86111A Strain of other muscle(s) and tendon(s) of posterior muscle group at lower leg level, right leg, initial encounter: Secondary | ICD-10-CM | POA: Diagnosis not present

## 2021-09-20 DIAGNOSIS — Y9364 Activity, baseball: Secondary | ICD-10-CM | POA: Insufficient documentation

## 2021-09-20 MED ORDER — OXYCODONE HCL 5 MG PO TABS
5.0000 mg | ORAL_TABLET | Freq: Once | ORAL | Status: AC
  Administered 2021-09-20: 5 mg via ORAL
  Filled 2021-09-20: qty 1

## 2021-09-20 MED ORDER — MORPHINE SULFATE 15 MG PO TABS: 7.5000 mg | ORAL_TABLET | ORAL | 0 refills | Status: AC | PRN

## 2021-09-20 MED ORDER — ACETAMINOPHEN 500 MG PO TABS
1000.0000 mg | ORAL_TABLET | Freq: Once | ORAL | Status: AC
  Administered 2021-09-20: 1000 mg via ORAL
  Filled 2021-09-20: qty 2

## 2021-09-20 MED ORDER — KETOROLAC TROMETHAMINE 15 MG/ML IJ SOLN
15.0000 mg | Freq: Once | INTRAMUSCULAR | Status: AC
  Administered 2021-09-20: 15 mg via INTRAMUSCULAR
  Filled 2021-09-20: qty 1

## 2021-09-20 NOTE — ED Notes (Signed)
Pt discharged to home.  Discharge instructions have been discussed with pt and/or family members.  Pt verbally acknowledges understanding of discharge instructions and endorses comprehension to check out at registration prior to leaving. 

## 2021-09-20 NOTE — Discharge Instructions (Signed)
Take 4 over the counter ibuprofen tablets 3 times a day or 2 over-the-counter naproxen tablets twice a day for pain. Also take tylenol 1000mg (2 extra strength) four times a day.   Follow up with ortho in the office.   Then take the pain medicine if you feel like you need it. Narcotics do not help with the pain, they only make you care about it less.  You can become addicted to this, people may break into your house to steal it.  It will constipate you.  If you drive under the influence of this medicine you can get a DUI.

## 2021-09-20 NOTE — ED Provider Notes (Signed)
[name] EMERGENCY DEPT Provider Note   CSN: [id number] Arrival date & time: 09/20/21  1949     History Chief Complaint  Patient presents with   Leg Injury    Angela Sutton is a 50 y.o. female.  50 yo F with a chief complaints of right calf pain.  This occurred while she was playing softball she was running from first base to second base and felt a sudden pop to the medial aspect of the calf.  Has had pain worse especially with bearing weight and flexing at the ankle.  She denies any pain at the knee or the hip.  Denies any other injury.  Has never had a problem like this before.  The history is provided by the patient.  Illness Severity:  Moderate Onset quality:  Gradual Duration:  2 hours Timing:  Constant Progression:  Unchanged Chronicity:  New Associated symptoms: myalgias   Associated symptoms: no chest pain, no congestion, no fever, no headaches, no nausea, no rhinorrhea, no shortness of breath, no vomiting and no wheezing       Past Medical History:  Diagnosis Date   No pertinent past medical history     Patient Active Problem List   Diagnosis Date Noted   H/O: cesarean section 07/28/2011   Anemia 07/28/2011    Past Surgical History:  Procedure Laterality Date   CESAREAN SECTION  2001     OB History     Gravida  4   Para  2   Term  2   Preterm      AB  1   Living  2      SAB  1   IAB      Ectopic      Multiple      Live Births  1           Family History  Problem Relation Age of Onset   Healthy Mother    Healthy Father     Social History   Tobacco Use   Smoking status: Never    Passive exposure: Never   Smokeless tobacco: Never  Vaping Use   Vaping Use: Never used  Substance Use Topics   Alcohol use: No   Drug use: No    Home Medications Prior to Admission medications   Medication Sig Start Date End Date Taking? Authorizing Provider  morphine (MSIR) 15 MG tablet Take 0.5 tablets (7.5  mg total) by mouth every 4 (four) hours as needed for severe pain. 09/20/21  Yes Melene Plan, DO  ferrous sulfate 325 (65 FE) MG tablet Take 1 tablet (325 mg total) by mouth 2 (two) times daily with a meal. 07/28/11 07/27/12  Haygood, Maris Berger, MD  prenatal vitamin w/FE, FA (PRENATAL 1 + 1) 27-1 MG TABS Take 1 tablet by mouth daily.      [provider]    Allergies    Codeine  Review of Systems   Review of Systems  Constitutional:  Negative for chills and fever.  HENT:  Negative for congestion and rhinorrhea.   Eyes:  Negative for redness and visual disturbance.  Respiratory:  Negative for shortness of breath and wheezing.   Cardiovascular:  Negative for chest pain and palpitations.  Gastrointestinal:  Negative for nausea and vomiting.  Genitourinary:  Negative for dysuria and urgency.  Musculoskeletal:  Positive for myalgias. Negative for arthralgias.  Skin:  Negative for pallor and wound.  Neurological:  Negative for dizziness and headaches.   Physical  Exam Updated Vital Signs BP (!) 158/95 (BP Location: Left Arm)   Pulse (!) 101   Temp 99.1 F (37.3 C) (Oral)   Resp 18   Ht 5\' 4"  (1.626 m)   Wt 74.4 kg   LMP 01/31/2021   SpO2 99%   BMI 28.15 kg/m   Physical Exam Vitals and nursing note reviewed.  Constitutional:      General: She is not in acute distress.    Appearance: She is well-developed. She is not diaphoretic.  HENT:     Head: Normocephalic and atraumatic.  Eyes:     Pupils: Pupils are equal, round, and reactive to light.  Cardiovascular:     Rate and Rhythm: Normal rate and regular rhythm.     Heart sounds: No murmur heard.   No friction rub. No gallop.  Pulmonary:     Effort: Pulmonary effort is normal.     Breath sounds: No wheezing or rales.  Abdominal:     General: There is no distension.     Palpations: Abdomen is soft.     Tenderness: There is no abdominal tenderness.  Musculoskeletal:        General: Tenderness present.     Cervical  back: Normal range of motion and neck supple.     Comments: Pain and swelling mostly to the medial aspect of the calf.  Thompson test appears to flex the foot.  No obvious deformity of the Achilles tendon.  Pulse motor and sensation intact distally.  Skin:    General: Skin is warm and dry.  Neurological:     Mental Status: She is alert and oriented to person, place, and time.  Psychiatric:        Behavior: Behavior normal.    ED Results / Procedures / Treatments   Labs (all labs ordered are listed, but only abnormal results are displayed) Labs Reviewed - No data to display  EKG None  Radiology DG Tibia/Fibula Right  Result Date: 09/20/2021 CLINICAL DATA:  Injury. Throbbing. Playing softball fall right lower extremity muscle pole EXAM: RIGHT TIBIA AND FIBULA - 2 VIEW COMPARISON:  None. FINDINGS: There is no evidence of fracture or other focal bone lesions. Soft tissues are unremarkable. IMPRESSION: Negative. Electronically Signed   By: 11/20/2021 M.D.   On: 09/20/2021 21:01    Procedures Procedures   Medications Ordered in ED Medications  ketorolac (TORADOL) 15 MG/ML injection 15 mg (has no administration in time range)  acetaminophen (TYLENOL) tablet 1,000 mg (has no administration in time range)  oxyCODONE (Oxy IR/ROXICODONE) immediate release tablet 5 mg (has no administration in time range)    ED Course  I have reviewed the triage vital signs and the nursing notes.  Pertinent labs & imaging results that were available during my care of the patient were reviewed by me and considered in my medical decision making (see chart for details).    MDM Rules/Calculators/A&P                           50 yo F with a chief complaint of right calf pain.  Most likely the patient has a gastrocnemius tear based on her history.  She does not have an obvious defect at her Achilles though does have mildly diminished movement of the Achilles with compression of the calf that may be  secondary to pain.  I will splint the patient in plantarflexion and have her nonweightbearing and follow-up with orthopedics.  10:00 PM:  I have discussed the diagnosis/risks/treatment options with the patient and believe the pt to be eligible for discharge home to follow-up with Ortho. We also discussed returning to the ED immediately if new or worsening sx occur. We discussed the sx which are most concerning (e.g., sudden worsening pain, fever, inability to tolerate by mouth) that necessitate immediate return. Medications administered to the patient during their visit and any new prescriptions provided to the patient are listed below.  Medications given during this visit Medications  ketorolac (TORADOL) 15 MG/ML injection 15 mg (has no administration in time range)  acetaminophen (TYLENOL) tablet 1,000 mg (has no administration in time range)  oxyCODONE (Oxy IR/ROXICODONE) immediate release tablet 5 mg (has no administration in time range)     The patient appears reasonably screen and/or stabilized for discharge and I doubt any other medical condition or other [facility] requiring further screening, evaluation, or treatment in the ED at this time prior to discharge.   Final Clinical Impression(s) / ED Diagnoses Final diagnoses:  Gastrocnemius tear, right, initial encounter    Rx / DC Orders ED Discharge Orders          Ordered    morphine (MSIR) 15 MG tablet  Every 4 hours PRN        09/20/21 2157             Melene Plan, DO 09/20/21 2200

## 2021-09-20 NOTE — ED Triage Notes (Signed)
Pt was playing softball and felt RLE muscle pull and "pop".  Unable to bear weight. Right pedal pulse 3+ strong. Cap refill <3 secs.

## 2023-03-13 IMAGING — DX DG TIBIA/FIBULA 2V*R*
2 series · 2 of 2 positions shown · non-contrast
Comparison: None.

CLINICAL DATA: Injury. Throbbing. Playing softball fall right lower
extremity muscle pole

EXAM:
RIGHT TIBIA AND FIBULA - 2 VIEW

[tibia ap]
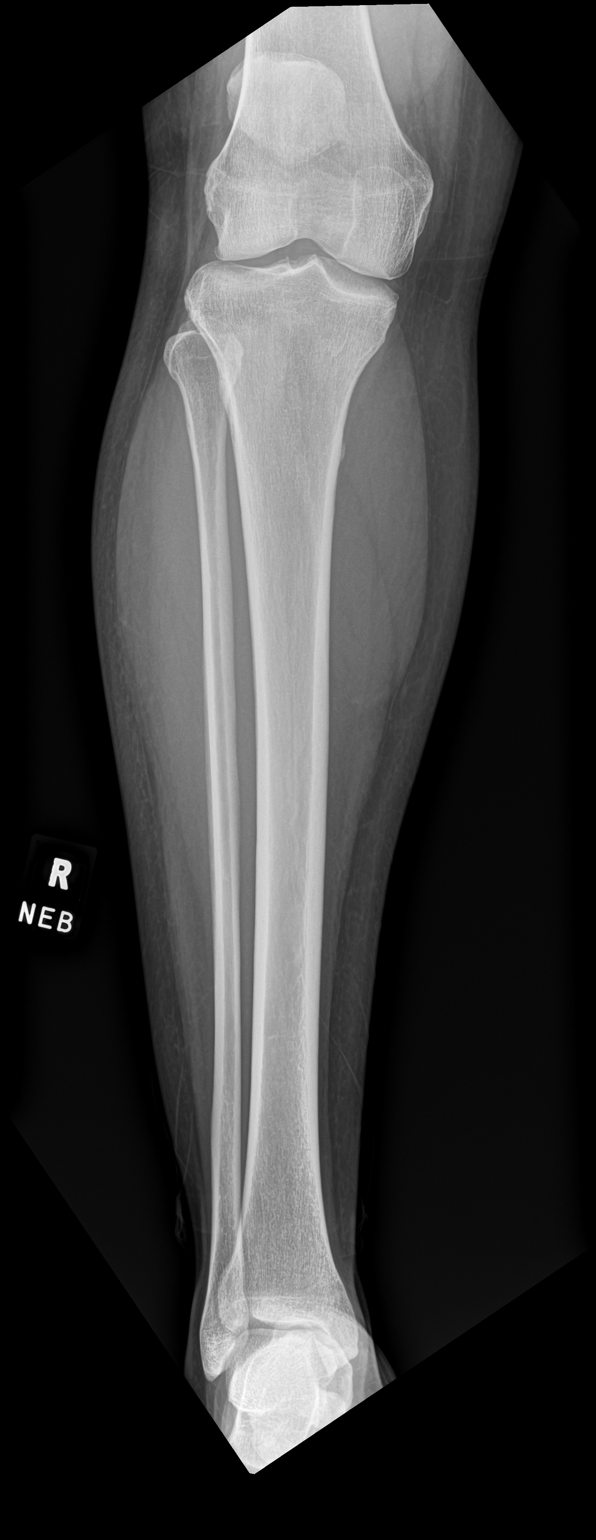

[tibia lat]
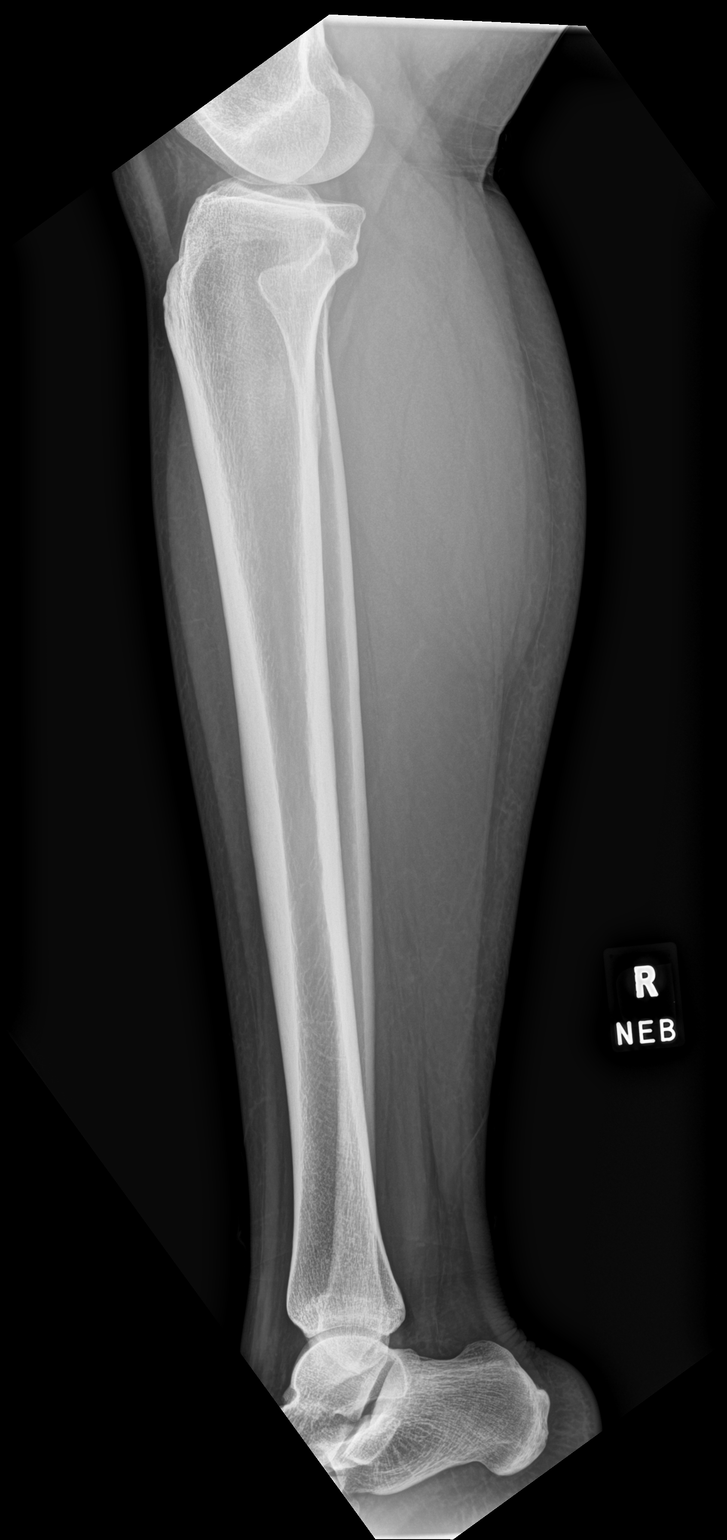

[2 of 2 positions shown; findings below may reference images not displayed]

FINDINGS: There is no evidence of fracture or other focal bone lesions. Soft
tissues are unremarkable.
IMPRESSION: Negative.
